# Patient Record
Sex: Male | Born: 1980 | Race: White | Hispanic: No | Marital: Married | Smoking: Never smoker
Health system: Southern US, Community
[De-identification: ages and names within clinical notes are randomized; demographics above are authoritative.]

## PROBLEM LIST (undated history)

## (undated) DIAGNOSIS — R209 Unspecified disturbances of skin sensation: Principal | ICD-10-CM

## (undated) HISTORY — PX: LAMINECTOMY: SHX219

## (undated) HISTORY — PX: LUMBAR DISC SURGERY: SHX700

## (undated) HISTORY — DX: Unspecified disturbances of skin sensation: R20.9

---

## 2005-08-04 ENCOUNTER — Ambulatory Visit (HOSPITAL_COMMUNITY): Admission: RE | Admit: 2005-08-04 | Discharge: 2005-08-05 | Payer: Self-pay | Admitting: Neurosurgery

## 2013-05-02 ENCOUNTER — Other Ambulatory Visit: Payer: Self-pay | Admitting: Family Medicine

## 2013-05-02 DIAGNOSIS — M6281 Muscle weakness (generalized): Secondary | ICD-10-CM

## 2013-05-09 ENCOUNTER — Ambulatory Visit
Admission: RE | Admit: 2013-05-09 | Discharge: 2013-05-09 | Disposition: A | Discharge status: Home / Self Care | Payer: BC Managed Care – PPO | Source: Ambulatory Visit | Attending: Family Medicine | Admitting: Family Medicine

## 2013-05-09 DIAGNOSIS — M6281 Muscle weakness (generalized): Secondary | ICD-10-CM

## 2013-05-29 ENCOUNTER — Encounter: Payer: Self-pay | Admitting: Neurology

## 2013-05-29 ENCOUNTER — Ambulatory Visit (INDEPENDENT_AMBULATORY_CARE_PROVIDER_SITE_OTHER): Payer: BC Managed Care – PPO | Admitting: Neurology

## 2013-05-29 VITALS — BP 121/73 | HR 65 | Temp 98.6°F | Ht 75.0 in | Wt 229.0 lb

## 2013-05-29 DIAGNOSIS — R209 Unspecified disturbances of skin sensation: Secondary | ICD-10-CM

## 2013-05-29 DIAGNOSIS — G5620 Lesion of ulnar nerve, unspecified upper limb: Secondary | ICD-10-CM

## 2013-05-29 DIAGNOSIS — G5621 Lesion of ulnar nerve, right upper limb: Secondary | ICD-10-CM

## 2013-05-29 HISTORY — DX: Unspecified disturbances of skin sensation: R20.9

## 2013-05-29 NOTE — Patient Instructions (Addendum)
I advised the patient to use a pillow on his right side beneath his elbow while sleeping. Check nerve conduction/ EMG study for a learner entrapment as well as MRI scan of cervical spine for C8 T1 radiculopathy. Return for followup in 6 weeks or call earlier if necessary.

## 2013-05-29 NOTE — Progress Notes (Signed)
[facility] [address] [place]. [place] [address] [phone]       OFFICE CONSULT NOTE  [name] Date of Birth:  October 09, 1980 Medical Record Number:  [id number]   Referring MD: Bonner Puna, PA-c  Reason for Referral:  numbness HPI: [name] is a 32 year old Caucasian male who was had intermittent progressive numbness in his extremities for the last 4-6 weeks. He states this began initially involving his right little and index finger and after a few days spread to up to his elbow. He also noticed to a lesser degree involvement of the left hand at that time as well. In the last couple weeks he has noticed some numbness involving his right leg as well and left foot. He however feels in the last 10 days his symptoms seem to have improved. He also admits to some weakness in his right hand and dropping some objects as well. He denies significant neck pain or headache or pain. He does however have a history of back pain and back surgery 2006 for herniated disc. He has mild residual left foot numbness on the outer aspect which is unchanged. He did undergo MRI scan of the brain 2 weeks ago on 05/09/13 which was normal. He also complains of mild feeling of lightheadedness off and on in the first few weeks and this episode began but she no longer notices that at present. He denies any loss of vision, vertigo, diplopia, focal extremity weakness, gait, balance difficulties or bladder hesitancy or urgency.  ROS:   14 system review of systems is positive for numbness, weakness, dizziness, tremor, muscle aches  PMH:  Past Medical History  Diagnosis Date  . Disturbance of skin sensation 05/29/2013    Social History:  History   Social History  . Marital Status: Married    Spouse Name: gwen    Number of Children: 3  . Years of Education: college   Occupational History  . teacher  [employer]   .     Social History Main Topics  . Smoking status: Never Smoker   .  Smokeless tobacco: Not on file  . Alcohol Use: Yes  . Drug Use: No  . Sexual Activity: Yes    Partners: Female   Other Topics Concern  . Not on file   Social History Narrative  . No narrative on file    Medications:   No current outpatient prescriptions on file prior to visit.   No current facility-administered medications on file prior to visit.    Allergies:  No Known Allergies  Physical Exam General: well developed, well nourished, seated, in no evident distress Head: head normocephalic and atraumatic. Orohparynx benign Neck: supple with no carotid or supraclavicular bruits Cardiovascular: regular rate and rhythm, no murmurs Musculoskeletal: no deformity Skin:  no rash/petichiae Vascular:  Normal pulses all extremities Filed Vitals:   05/29/13 1346  BP: 121/73  Pulse: 65  Temp: 98.6 F (37 C)    Neurologic Exam Mental Status: Awake and fully alert. Oriented to place and time. Recent and remote memory intact. Attention span, concentration and fund of knowledge appropriate. Mood and affect appropriate.  Cranial Nerves: Fundoscopic exam reveals sharp disc margins. Pupils equal, briskly reactive to light. Extraocular movements full without nystagmus. Visual fields full to confrontation. Hearing intact. Facial sensation intact. Face, tongue, palate moves normally and symmetrically.  Motor: Normal bulk and tone. Normal strength in all tested extremity muscles. Mild weakness of the right little finger long flexors.  Sensory.: intact to touch and pinprick and vibratory. Positive Tinel's sign over the right ulnar groove. Coordination: Rapid alternating movements normal in all extremities. Finger-to-nose and heel-to-shin performed accurately bilaterally. Gait and Station: Arises from chair without difficulty. Stance is normal. Gait demonstrates normal stride length and balance . Able to heel, toe and tandem walk without difficulty.  Reflexes: 1+ and symmetric. Toes downgoing.        ASSESSMENT: 32 year old Caucasian male with intermittent fleeting paresthesias of unclear etiology. 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    PLAN:  I advised the patient to use a pillow on his right side beneath his elbow while sleeping. Check nerve conduction/ EMG study for right ulnar nerve entrapment as well as MRI scan of cervical spine for C8 T1 radiculopathy. Return for followup in 6 weeks or call earlier if necessary.

## 2013-06-12 ENCOUNTER — Encounter: Payer: BC Managed Care – PPO | Admitting: Neurology

## 2013-07-10 ENCOUNTER — Ambulatory Visit: Payer: BC Managed Care – PPO | Admitting: Neurology

## 2019-11-02 ENCOUNTER — Ambulatory Visit: Payer: Self-pay | Attending: Internal Medicine

## 2019-11-02 DIAGNOSIS — Z23 Encounter for immunization: Secondary | ICD-10-CM | POA: Insufficient documentation

## 2019-11-02 NOTE — Progress Notes (Signed)
   Covid-19 Vaccination Clinic  Name:  LAIDEN MILLES    MRN: [id number] DOB: 09-08-1980  11/02/2019  Mr. Ramson was observed post Covid-19 immunization for 15 minutes without incidence. He was provided with Vaccine Information Sheet and instruction to access the V-Safe system.   Mr. Yaffe was instructed to call 911 with any severe reactions post vaccine: Marland Kitchen Difficulty breathing  . Swelling of your face and throat  . A fast heartbeat  . A bad rash all over your body  . Dizziness and weakness    Immunizations Administered    Name Date Dose VIS Date Route   Pfizer COVID-19 Vaccine 11/02/2019  9:44 AM 0.3 mL 08/16/2019 Intramuscular   Manufacturer: [employer], [employer]   Lot: [id number]   NDC: [address]-2

## 2019-11-26 ENCOUNTER — Ambulatory Visit: Payer: Self-pay | Attending: Internal Medicine

## 2019-11-26 DIAGNOSIS — Z23 Encounter for immunization: Secondary | ICD-10-CM

## 2019-11-26 NOTE — Progress Notes (Signed)
   Covid-19 Vaccination Clinic  Name:  Richard Higgins    MRN: [id number] DOB: March 17, 1981  11/26/2019  Richard Higgins was observed post Covid-19 immunization for 15 minutes without incident. He was provided with Vaccine Information Sheet and instruction to access the V-Safe system.   Richard Higgins was instructed to call 911 with any severe reactions post vaccine: Marland Kitchen Difficulty breathing  . Swelling of face and throat  . A fast heartbeat  . A bad rash all over body  . Dizziness and weakness   Immunizations Administered    Name Date Dose VIS Date Route   Pfizer COVID-19 Vaccine 11/26/2019 10:35 AM 0.3 mL 08/16/2019 Intramuscular   Manufacturer: [employer], [employer]   Lot: [id number]   NDC: [address]-0

## 2019-11-27 ENCOUNTER — Ambulatory Visit: Payer: Self-pay

## 2021-10-25 DIAGNOSIS — H52223 Regular astigmatism, bilateral: Secondary | ICD-10-CM | POA: Diagnosis not present

## 2021-10-25 DIAGNOSIS — H5213 Myopia, bilateral: Secondary | ICD-10-CM | POA: Diagnosis not present

## 2021-10-25 DIAGNOSIS — H1045 Other chronic allergic conjunctivitis: Secondary | ICD-10-CM | POA: Diagnosis not present

## 2021-11-15 DIAGNOSIS — Z0000 Encounter for general adult medical examination without abnormal findings: Secondary | ICD-10-CM | POA: Diagnosis not present

## 2021-11-15 DIAGNOSIS — Z13220 Encounter for screening for lipoid disorders: Secondary | ICD-10-CM | POA: Diagnosis not present

## 2022-06-24 DIAGNOSIS — R69 Illness, unspecified: Secondary | ICD-10-CM | POA: Diagnosis not present

## 2022-06-24 DIAGNOSIS — R0789 Other chest pain: Secondary | ICD-10-CM | POA: Diagnosis not present

## 2022-06-24 DIAGNOSIS — I10 Essential (primary) hypertension: Secondary | ICD-10-CM | POA: Diagnosis not present

## 2024-02-05 NOTE — Progress Notes (Signed)
 [facility] [address] [place] [address] Phone: [phone] Subjective:   Richard Higgins, am serving as a scribe for Dr. Ronnell Coins.  I'm seeing this patient by the request  of:  Patient, No Pcp Per  CC: Left hand pain and numbness  OZH:[id number]  Richard Higgins is a 43 y.o. male coming in with complaint of L hand tingling and numbness. Patient states that a couple months ago he was playing basketball and he took a hit to the L scapula. Had some neck stiffness. Has tried chiropractic care and PT which were helpful for 2 days. Some PT exercises increased symptoms. Pain can radiate from the elbow to the hand. Trap stretching and arms overhead increase tingling. Symptoms started in index and middle fingers but now sometimes will have symptoms throughout hand. Hx of surgery for lumbar spinal stenosis.       Past Medical History:  Diagnosis Date   Disturbance of skin sensation 05/29/2013   Past Surgical History:  Procedure Laterality Date   LAMINECTOMY     LUMBAR DISC SURGERY     Social History   Socioeconomic History   Marital status: Married    Spouse name: gwen   Number of children: 3   Years of education: college   Highest education level: Not on file  Occupational History   Occupation: Runner, broadcasting/film/video  ( [employer]    Employer: [employer]  Tobacco Use   Smoking status: Never   Smokeless tobacco: Not on file  Substance and Sexual Activity   Alcohol use: Yes   Drug use: No   Sexual activity: Yes    Partners: Female  Other Topics Concern   Not on file  Social History Narrative   Not on file   Social Drivers of Health   Financial Resource Strain: Not on file  Food Insecurity: Not on file  Transportation Needs: Not on file  Physical Activity: Not on file  Stress: Not on file  Social Connections: Not on file   No Known Allergies No family history on file.     Current Outpatient Medications (Analgesics):     ibuprofen (ADVIL,MOTRIN) 200 MG tablet, Take 200 mg by mouth daily. Take 3 tablets daily, twice a week.     Reviewed prior external information including notes and imaging from  primary care provider As well as notes that were available from care everywhere and other healthcare systems.  Past medical history, social, surgical and family history all reviewed in electronic medical record.  No pertanent information unless stated regarding to the chief complaint.   Review of Systems:  No headache, visual changes, nausea, vomiting, diarrhea, constipation, dizziness, abdominal pain, skin rash, fevers, chills, night sweats, weight loss, swollen lymph nodes, body aches, joint swelling, chest pain, shortness of breath, mood changes. POSITIVE muscle aches  Objective  There were no vitals taken for this visit.   General: No apparent distress alert and oriented x3 mood and affect normal, dressed appropriately.  HEENT: Pupils equal, extraocular movements intact  Respiratory: Patient's speak in full sentences and does not appear short of breath  Cardiovascular: No lower extremity edema, non tender, no erythema   Left hand exam shows good range of motion, very mild weakness noted more in the C7 and C8 distribution.  Negative Spurling's of the neck noted.  Limited muscular skeletal ultrasound was performed and interpreted by Ronnell Coins, M  For regular median nerve dilatation at the part of the carpal tunnel  area but unfortunately patient does have what appears to be dilatation proximally to this area all the way near the antecubital area.  Pain with palpation in the area. 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

## 2024-02-06 ENCOUNTER — Other Ambulatory Visit: Payer: Self-pay

## 2024-02-06 ENCOUNTER — Ambulatory Visit: Payer: Self-pay | Admitting: Family Medicine

## 2024-02-06 ENCOUNTER — Ambulatory Visit: Payer: Self-pay | Admitting: Student in an Organized Health Care Education/Training Program

## 2024-02-06 ENCOUNTER — Encounter: Payer: Self-pay | Admitting: Family Medicine

## 2024-02-06 ENCOUNTER — Ambulatory Visit

## 2024-02-06 VITALS — BP 120/88 | HR 50 | Ht 75.0 in | Wt 213.0 lb

## 2024-02-06 DIAGNOSIS — R200 Anesthesia of skin: Secondary | ICD-10-CM | POA: Diagnosis not present

## 2024-02-06 DIAGNOSIS — R202 Paresthesia of skin: Secondary | ICD-10-CM | POA: Diagnosis not present

## 2024-02-06 DIAGNOSIS — G5602 Carpal tunnel syndrome, left upper limb: Secondary | ICD-10-CM | POA: Diagnosis not present

## 2024-02-06 MED ORDER — GABAPENTIN 100 MG PO CAPS: 200.0000 mg | ORAL_CAPSULE | Freq: Every day | ORAL | 0 refills | Status: AC

## 2024-02-06 NOTE — Patient Instructions (Addendum)
 Xray today Gabapentin 200mg  Exercises Avoid holding phone and watch sleep  position See me in 6-8 weeks

## 2024-02-06 NOTE — Assessment & Plan Note (Signed)
 Seems to be more in the entrapment.  Seems to be more in the forearm than anywhere else.  Discussed with patient that it does seem to be superficial.  Discussed different exercises and work with Event organiser, discouraged certain repetitive motion especially with wrist flexion that could be potentially contributing.  Do not feel that a brace would be beneficial and no significant dilatation noted at the carpal tunnel but seems to be more proximal.  Differential includes cervical radiculopathy and still carpal tunnel and a nerve conduction study could be beneficial if necessary.  Started gabapentin 200 mg.  Follow-up again 2 months

## 2024-02-08 ENCOUNTER — Ambulatory Visit: Payer: Self-pay | Admitting: Sports Medicine

## 2024-03-05 ENCOUNTER — Ambulatory Visit: Payer: Self-pay | Admitting: Family Medicine

## 2024-03-21 NOTE — Progress Notes (Deleted)
 [facility] [address] [place] [address] Phone: [phone] Subjective:    I'm seeing this patient by the request  of:  Patient, No Pcp Per  CC:   YEP:Dlagzrupcz  02/05/2204 Seems to be more in the entrapment.  Seems to be more in the forearm than anywhere else.  Discussed with patient that it does seem to be superficial.  Discussed different exercises and work with Event organiser, discouraged certain repetitive motion especially with wrist flexion that could be potentially contributing.  Do not feel that a brace would be beneficial and no significant dilatation noted at the carpal tunnel but seems to be more proximal.  Differential includes cervical radiculopathy and still carpal tunnel and a nerve conduction study could be beneficial if necessary.  Started gabapentin  200 mg.  Follow-up again 2 months     Update 03/26/2024 Richard Higgins is a 43 y.o. male coming in with complaint of L hand tingling. Patient states        Past Medical History:  Diagnosis Date   Disturbance of skin sensation 05/29/2013   Past Surgical History:  Procedure Laterality Date   LAMINECTOMY     LUMBAR DISC SURGERY     Social History   Socioeconomic History   Marital status: Married    Spouse name: gwen   Number of children: 3   Years of education: college   Highest education level: Not on file  Occupational History   Occupation: Runner, broadcasting/film/video  ( [employer]    Employer: [employer]  Tobacco Use   Smoking status: Never   Smokeless tobacco: Not on file  Substance and Sexual Activity   Alcohol use: Yes   Drug use: No   Sexual activity: Yes    Partners: Female  Other Topics Concern   Not on file  Social History Narrative   Not on file   Social Drivers of Health   Financial Resource Strain: Not on file  Food Insecurity: Not on file  Transportation Needs: Not on file  Physical Activity: Not on file  Stress: Not on file  Social Connections: Not on  file   No Known Allergies No family history on file.     Current Outpatient Medications (Analgesics):    ibuprofen (ADVIL,MOTRIN) 200 MG tablet, Take 200 mg by mouth daily. Take 3 tablets daily, twice a week.   Current Outpatient Medications (Other):    gabapentin  (NEURONTIN ) 100 MG capsule, Take 2 capsules (200 mg total) by mouth at bedtime.   Reviewed prior external information including notes and imaging from  primary care provider As well as notes that were available from care everywhere and other healthcare systems.  Past medical history, social, surgical and family history all reviewed in electronic medical record.  No pertanent information unless stated regarding to the chief complaint.   Review of Systems:  No headache, visual changes, nausea, vomiting, diarrhea, constipation, dizziness, abdominal pain, skin rash, fevers, chills, night sweats, weight loss, swollen lymph nodes, body aches, joint swelling, chest pain, shortness of breath, mood changes. POSITIVE muscle aches  Objective  There were no vitals taken for this visit.   General: No apparent distress alert and oriented x3 mood and affect normal, dressed appropriately.  HEENT: Pupils equal, extraocular movements intact  Respiratory: Patient's speak in full sentences and does not appear short of breath  Cardiovascular: No lower extremity edema, non tender, no erythema      Impression and Recommendations:

## 2024-03-26 ENCOUNTER — Ambulatory Visit: Admitting: Family Medicine
